# Patient Record
Sex: Female | Born: 1945 | Race: White | Hispanic: No | Marital: Married | Smoking: Never smoker
Health system: Southern US, Community
[De-identification: ages and names within clinical notes are randomized; demographics above are authoritative.]

## PROBLEM LIST (undated history)

## (undated) DIAGNOSIS — E7800 Pure hypercholesterolemia, unspecified: Secondary | ICD-10-CM

## (undated) HISTORY — PX: HEMORRHOIDECTOMY WITH HEMORRHOID BANDING: SHX5633

## (undated) HISTORY — DX: Pure hypercholesterolemia, unspecified: E78.00

---

## 1999-08-30 ENCOUNTER — Encounter: Admission: RE | Admit: 1999-08-30 | Discharge: 1999-08-30 | Payer: Self-pay | Admitting: Obstetrics and Gynecology

## 2000-09-11 ENCOUNTER — Encounter: Admission: RE | Admit: 2000-09-11 | Discharge: 2000-09-11 | Payer: Self-pay | Admitting: Obstetrics and Gynecology

## 2000-09-11 ENCOUNTER — Encounter: Payer: Self-pay | Admitting: Obstetrics and Gynecology

## 2001-02-28 ENCOUNTER — Encounter (INDEPENDENT_AMBULATORY_CARE_PROVIDER_SITE_OTHER): Payer: Self-pay

## 2001-02-28 ENCOUNTER — Other Ambulatory Visit: Admission: RE | Admit: 2001-02-28 | Discharge: 2001-02-28 | Payer: Self-pay | Admitting: Obstetrics and Gynecology

## 2001-09-13 ENCOUNTER — Encounter: Admission: RE | Admit: 2001-09-13 | Discharge: 2001-09-13 | Payer: Self-pay | Admitting: Obstetrics and Gynecology

## 2001-09-13 ENCOUNTER — Encounter: Payer: Self-pay | Admitting: Obstetrics and Gynecology

## 2002-09-29 ENCOUNTER — Encounter: Admission: RE | Admit: 2002-09-29 | Discharge: 2002-09-29 | Payer: Self-pay | Admitting: Obstetrics and Gynecology

## 2002-09-29 ENCOUNTER — Encounter: Payer: Self-pay | Admitting: Obstetrics and Gynecology

## 2003-10-20 ENCOUNTER — Encounter: Admission: RE | Admit: 2003-10-20 | Discharge: 2003-10-20 | Payer: Self-pay | Admitting: Obstetrics and Gynecology

## 2004-11-10 ENCOUNTER — Encounter: Admission: RE | Admit: 2004-11-10 | Discharge: 2004-11-10 | Payer: Self-pay | Admitting: Obstetrics and Gynecology

## 2005-02-24 ENCOUNTER — Ambulatory Visit: Payer: Self-pay | Admitting: Family Medicine

## 2005-03-15 ENCOUNTER — Ambulatory Visit: Payer: Self-pay | Admitting: Family Medicine

## 2005-04-28 ENCOUNTER — Ambulatory Visit: Payer: Self-pay | Admitting: Family Medicine

## 2005-09-01 ENCOUNTER — Ambulatory Visit: Payer: Self-pay | Admitting: Family Medicine

## 2005-11-28 ENCOUNTER — Encounter: Admission: RE | Admit: 2005-11-28 | Discharge: 2005-11-28 | Payer: Self-pay | Admitting: Obstetrics and Gynecology

## 2006-11-30 ENCOUNTER — Encounter: Admission: RE | Admit: 2006-11-30 | Discharge: 2006-11-30 | Payer: Self-pay | Admitting: Obstetrics and Gynecology

## 2007-12-10 ENCOUNTER — Encounter: Admission: RE | Admit: 2007-12-10 | Discharge: 2007-12-10 | Payer: Self-pay | Admitting: Obstetrics and Gynecology

## 2008-12-10 ENCOUNTER — Encounter: Admission: RE | Admit: 2008-12-10 | Discharge: 2008-12-10 | Payer: Self-pay | Admitting: Family Medicine

## 2010-01-05 ENCOUNTER — Encounter: Admission: RE | Admit: 2010-01-05 | Discharge: 2010-01-05 | Payer: Self-pay | Admitting: Obstetrics and Gynecology

## 2010-02-16 ENCOUNTER — Encounter: Admission: RE | Admit: 2010-02-16 | Discharge: 2010-02-16 | Payer: Self-pay | Admitting: Obstetrics and Gynecology

## 2010-12-11 ENCOUNTER — Encounter: Payer: Self-pay | Admitting: Obstetrics and Gynecology

## 2010-12-12 ENCOUNTER — Other Ambulatory Visit: Payer: Self-pay | Admitting: Obstetrics and Gynecology

## 2010-12-12 DIAGNOSIS — Z1239 Encounter for other screening for malignant neoplasm of breast: Secondary | ICD-10-CM

## 2011-01-06 ENCOUNTER — Ambulatory Visit
Admission: RE | Admit: 2011-01-06 | Discharge: 2011-01-06 | Disposition: A | Discharge status: Home / Self Care | Payer: 59 | Source: Ambulatory Visit | Attending: Obstetrics and Gynecology | Admitting: Obstetrics and Gynecology

## 2011-01-06 DIAGNOSIS — Z1239 Encounter for other screening for malignant neoplasm of breast: Secondary | ICD-10-CM

## 2011-10-31 ENCOUNTER — Other Ambulatory Visit: Payer: Self-pay | Admitting: Obstetrics and Gynecology

## 2011-10-31 DIAGNOSIS — N61 Mastitis without abscess: Secondary | ICD-10-CM

## 2011-11-23 ENCOUNTER — Ambulatory Visit
Admission: RE | Admit: 2011-11-23 | Discharge: 2011-11-23 | Disposition: A | Discharge status: Home / Self Care | Payer: 59 | Source: Ambulatory Visit | Attending: Obstetrics and Gynecology | Admitting: Obstetrics and Gynecology

## 2011-11-23 DIAGNOSIS — N61 Mastitis without abscess: Secondary | ICD-10-CM

## 2011-12-07 ENCOUNTER — Other Ambulatory Visit: Payer: Self-pay | Admitting: Obstetrics and Gynecology

## 2011-12-07 DIAGNOSIS — Z1231 Encounter for screening mammogram for malignant neoplasm of breast: Secondary | ICD-10-CM

## 2012-01-08 ENCOUNTER — Ambulatory Visit
Admission: RE | Admit: 2012-01-08 | Discharge: 2012-01-08 | Disposition: A | Discharge status: Home / Self Care | Payer: 59 | Source: Ambulatory Visit | Attending: Obstetrics and Gynecology | Admitting: Obstetrics and Gynecology

## 2012-01-08 DIAGNOSIS — Z1231 Encounter for screening mammogram for malignant neoplasm of breast: Secondary | ICD-10-CM

## 2012-12-09 ENCOUNTER — Other Ambulatory Visit: Payer: Self-pay | Admitting: Obstetrics and Gynecology

## 2012-12-09 DIAGNOSIS — Z1231 Encounter for screening mammogram for malignant neoplasm of breast: Secondary | ICD-10-CM

## 2013-01-08 ENCOUNTER — Other Ambulatory Visit: Payer: Self-pay | Admitting: Family Medicine

## 2013-01-08 ENCOUNTER — Ambulatory Visit
Admission: RE | Admit: 2013-01-08 | Discharge: 2013-01-08 | Disposition: A | Discharge status: Home / Self Care | Payer: 59 | Source: Ambulatory Visit | Attending: Obstetrics and Gynecology | Admitting: Obstetrics and Gynecology

## 2013-01-14 ENCOUNTER — Other Ambulatory Visit: Payer: Self-pay | Admitting: Obstetrics and Gynecology

## 2013-01-27 ENCOUNTER — Ambulatory Visit
Admission: RE | Admit: 2013-01-27 | Discharge: 2013-01-27 | Disposition: A | Discharge status: Home / Self Care | Payer: 59 | Source: Ambulatory Visit | Attending: Obstetrics and Gynecology | Admitting: Obstetrics and Gynecology

## 2014-01-20 ENCOUNTER — Other Ambulatory Visit: Payer: Self-pay

## 2014-01-20 DIAGNOSIS — Z1231 Encounter for screening mammogram for malignant neoplasm of breast: Secondary | ICD-10-CM

## 2014-02-09 ENCOUNTER — Ambulatory Visit
Admission: RE | Admit: 2014-02-09 | Discharge: 2014-02-09 | Disposition: A | Discharge status: Home / Self Care | Payer: Medicare Other | Source: Ambulatory Visit

## 2014-02-09 DIAGNOSIS — Z1231 Encounter for screening mammogram for malignant neoplasm of breast: Secondary | ICD-10-CM

## 2014-02-12 ENCOUNTER — Other Ambulatory Visit: Payer: Self-pay | Admitting: Obstetrics and Gynecology

## 2014-02-12 DIAGNOSIS — R928 Other abnormal and inconclusive findings on diagnostic imaging of breast: Secondary | ICD-10-CM

## 2014-02-19 ENCOUNTER — Ambulatory Visit
Admission: RE | Admit: 2014-02-19 | Discharge: 2014-02-19 | Disposition: A | Discharge status: Home / Self Care | Payer: Medicare Other | Source: Ambulatory Visit | Attending: Obstetrics and Gynecology | Admitting: Obstetrics and Gynecology

## 2014-02-19 DIAGNOSIS — R928 Other abnormal and inconclusive findings on diagnostic imaging of breast: Secondary | ICD-10-CM

## 2015-01-12 ENCOUNTER — Other Ambulatory Visit: Payer: Self-pay

## 2015-01-12 DIAGNOSIS — Z1231 Encounter for screening mammogram for malignant neoplasm of breast: Secondary | ICD-10-CM

## 2015-02-11 ENCOUNTER — Ambulatory Visit: Payer: Medicare Other

## 2015-02-19 ENCOUNTER — Ambulatory Visit
Admission: RE | Admit: 2015-02-19 | Discharge: 2015-02-19 | Disposition: A | Discharge status: Home / Self Care | Payer: Medicare Other | Source: Ambulatory Visit

## 2015-02-19 DIAGNOSIS — Z1231 Encounter for screening mammogram for malignant neoplasm of breast: Secondary | ICD-10-CM

## 2016-01-21 ENCOUNTER — Other Ambulatory Visit: Payer: Self-pay | Admitting: Obstetrics and Gynecology

## 2016-01-21 DIAGNOSIS — R5381 Other malaise: Secondary | ICD-10-CM

## 2016-02-09 ENCOUNTER — Other Ambulatory Visit: Payer: Self-pay | Admitting: Obstetrics and Gynecology

## 2016-02-10 ENCOUNTER — Other Ambulatory Visit: Payer: Self-pay | Admitting: Obstetrics and Gynecology

## 2016-02-10 DIAGNOSIS — M810 Age-related osteoporosis without current pathological fracture: Secondary | ICD-10-CM

## 2016-02-10 DIAGNOSIS — Z1231 Encounter for screening mammogram for malignant neoplasm of breast: Secondary | ICD-10-CM

## 2016-03-02 ENCOUNTER — Ambulatory Visit
Admission: RE | Admit: 2016-03-02 | Discharge: 2016-03-02 | Disposition: A | Discharge status: Home / Self Care | Payer: Medicare Other | Source: Ambulatory Visit | Attending: Obstetrics and Gynecology | Admitting: Obstetrics and Gynecology

## 2016-03-02 DIAGNOSIS — M810 Age-related osteoporosis without current pathological fracture: Secondary | ICD-10-CM

## 2016-03-02 DIAGNOSIS — Z1231 Encounter for screening mammogram for malignant neoplasm of breast: Secondary | ICD-10-CM

## 2017-01-19 ENCOUNTER — Other Ambulatory Visit: Payer: Self-pay | Admitting: Obstetrics and Gynecology

## 2017-01-19 DIAGNOSIS — Z1231 Encounter for screening mammogram for malignant neoplasm of breast: Secondary | ICD-10-CM

## 2017-03-08 ENCOUNTER — Ambulatory Visit
Admission: RE | Admit: 2017-03-08 | Discharge: 2017-03-08 | Disposition: A | Discharge status: Home / Self Care | Payer: Medicare Other | Source: Ambulatory Visit | Attending: Obstetrics and Gynecology | Admitting: Obstetrics and Gynecology

## 2017-03-08 DIAGNOSIS — Z1231 Encounter for screening mammogram for malignant neoplasm of breast: Secondary | ICD-10-CM

## 2018-01-30 ENCOUNTER — Other Ambulatory Visit: Payer: Self-pay | Admitting: Obstetrics and Gynecology

## 2018-01-30 DIAGNOSIS — Z1231 Encounter for screening mammogram for malignant neoplasm of breast: Secondary | ICD-10-CM

## 2018-02-20 ENCOUNTER — Other Ambulatory Visit: Payer: Self-pay | Admitting: Obstetrics and Gynecology

## 2018-02-20 DIAGNOSIS — M810 Age-related osteoporosis without current pathological fracture: Secondary | ICD-10-CM

## 2018-03-11 ENCOUNTER — Ambulatory Visit: Payer: Medicare Other

## 2018-03-22 ENCOUNTER — Ambulatory Visit
Admission: RE | Admit: 2018-03-22 | Discharge: 2018-03-22 | Disposition: A | Discharge status: Home / Self Care | Payer: Medicare Other | Source: Ambulatory Visit | Attending: Obstetrics and Gynecology | Admitting: Obstetrics and Gynecology

## 2018-03-22 DIAGNOSIS — M810 Age-related osteoporosis without current pathological fracture: Secondary | ICD-10-CM

## 2018-03-22 DIAGNOSIS — Z1231 Encounter for screening mammogram for malignant neoplasm of breast: Secondary | ICD-10-CM

## 2018-08-21 ENCOUNTER — Encounter: Payer: Self-pay | Admitting: Allergy and Immunology

## 2018-08-21 ENCOUNTER — Ambulatory Visit (INDEPENDENT_AMBULATORY_CARE_PROVIDER_SITE_OTHER): Payer: Medicare Other | Admitting: Allergy and Immunology

## 2018-08-21 VITALS — BP 102/70 | HR 80 | Temp 98.4°F | Resp 18 | Ht 64.2 in | Wt 123.0 lb

## 2018-08-21 DIAGNOSIS — T781XXD Other adverse food reactions, not elsewhere classified, subsequent encounter: Secondary | ICD-10-CM

## 2018-08-21 DIAGNOSIS — R197 Diarrhea, unspecified: Secondary | ICD-10-CM

## 2018-08-21 DIAGNOSIS — K5229 Other allergic and dietetic gastroenteritis and colitis: Secondary | ICD-10-CM | POA: Diagnosis not present

## 2018-08-21 DIAGNOSIS — K909 Intestinal malabsorption, unspecified: Secondary | ICD-10-CM

## 2018-08-21 DIAGNOSIS — Z91018 Allergy to other foods: Secondary | ICD-10-CM

## 2018-08-21 MED ORDER — COLESTIPOL HCL 1 G PO TABS: ORAL_TABLET | ORAL | 5 refills | Status: AC

## 2018-08-21 NOTE — Patient Instructions (Addendum)
  1.  Allergen avoidance measures?  2.  Blood -celiac screen with IgA  3.  Colestipol 1 g -1 tablet 1-2 times a day  4.  Contact clinic with update regarding response to colestipol in 2 weeks  5.  Further evaluation and treatment?

## 2018-08-21 NOTE — Progress Notes (Signed)
Dear Dr. Sol Passer,  Thank you for referring Kathleen Gross to the [facility] [place] on 08/21/2018.   Below is a summation of this patient's evaluation and recommendations.  Thank you for your referral. I will keep you informed about this patient's response to treatment.   If you have any questions please do not hesitate to contact me.   Sincerely,  Jessica Priest, MD Allergy / Immunology [place] [facility]   ______________________________________________________________________    NEW PATIENT NOTE  Referring Provider: Olive Bass, MD Primary Provider: Olive Bass, MD Date of office visit: 08/21/2018    Subjective:   Chief Complaint:  Kathleen Gross (DOB: 02/25/1946) is a 72 y.o. female who presents to the clinic on 08/21/2018 with a chief complaint of Diarrhea .     HPI: Kathleen Gross presents to this clinic in evaluation of diarrhea and possible food allergy.  She has a greater than 10-year history of problems with diarrhea.  Most of this diarrhea appears to be postprandial and it occurs on most days of the week and sometimes it will express itself only in the morning after breakfast or it can extend into the entire day.  She does not appear to have cramping or bloating associated with this diarrhea.  She does have tenesmus with this diarrhea and sometimes it is difficult for her to make it to the bathroom.  She has apparently had a colonoscopy 3 years ago by Dr. Jennye Boroughs which was normal.  She has tried Metamucil and Pepto-Bismol and hyoscyamine none of which has helped her to any great degree.  She is wondering about the possibility of a food allergy contributing to this issue.  She does not really have any significant allergic disease.  She did have a history of seasonal allergic rhinitis which was very mild as a young adult but for the most part that has resolved.  Past Medical History:    Diagnosis Date  . High cholesterol     Past Surgical History:  Procedure Laterality Date  . HEMORRHOIDECTOMY WITH HEMORRHOID BANDING      Allergies as of 08/21/2018   No Known Allergies     Medication List      CENTRUM SILVER PO Take by mouth.   CITRACAL PO Take by mouth.   hyoscyamine 0.125 MG SL tablet Commonly known as:  LEVSIN SL TAKE 1 TABLET (0.125 MG TOTAL) BY MOUTH EVERY 4 (FOUR) HOURS AS NEEDED FOR CRAMPING.   simvastatin 40 MG tablet Commonly known as:  ZOCOR TAKE 1 TABLET (40 MG TOTAL) BY MOUTH EVERY EVENING. CHOLESTEROL   Vitamin D3 1000 units Caps Take by mouth.       Review of systems negative except as noted in HPI / PMHx or noted below:  Review of Systems  Constitutional: Negative.   HENT: Negative.   Eyes: Negative.   Respiratory: Negative.   Cardiovascular: Negative.   Gastrointestinal: Negative.   Genitourinary: Negative.   Musculoskeletal: Negative.   Skin: Negative.   Neurological: Negative.   Endo/Heme/Allergies: Negative.   Psychiatric/Behavioral: Negative.     Family History  Problem Relation Age of Onset  . Hypertension Mother   . Lung cancer Brother        Agent orange  . Breast cancer Neg Hx     Social History   Socioeconomic History  . Marital status: Married    Spouse name: Not on file  .  Number of children: Not on file  . Years of education: Not on file  . Highest education level: Not on file  Occupational History  . Not on file  Social Needs  . Financial resource strain: Not on file  . Food insecurity:    Worry: Not on file    Inability: Not on file  . Transportation needs:    Medical: Not on file    Non-medical: Not on file  Tobacco Use  . Smoking status: Never Smoker  . Smokeless tobacco: Never Used  Substance and Sexual Activity  . Alcohol use: Not on file  . Drug use: Not on file  . Sexual activity: Not on file  Lifestyle  . Physical activity:    Days per week: Not on file    Minutes per  session: Not on file  . Stress: Not on file  Relationships  . Social connections:    Talks on phone: Not on file    Gets together: Not on file    Attends religious service: Not on file    Active member of club or organization: Not on file    Attends meetings of clubs or organizations: Not on file    Relationship status: Not on file  . Intimate partner violence:    Fear of current or ex partner: Not on file    Emotionally abused: Not on file    Physically abused: Not on file    Forced sexual activity: Not on file  Other Topics Concern  . Not on file  Social History Narrative  . Not on file    Environmental and Social history  Lives in a house with a dry environment, a dog located inside the household, carpet in the bedroom, plastic on the bed, plastic on the pillow, no smokers located inside the household.  Objective:   Vitals:   08/21/18 0935  BP: 102/70  Pulse: 80  Resp: 18  Temp: 98.4 F (36.9 C)   Height: 5' 4.2" (163.1 cm) Weight: 123 lb (55.8 kg)  Physical Exam  HENT:  Head: Normocephalic. Head is without right periorbital erythema and without left periorbital erythema.  Right Ear: Tympanic membrane, external ear and ear canal normal.  Left Ear: Tympanic membrane, external ear and ear canal normal.  Nose: Nose normal. No mucosal edema or rhinorrhea.  Mouth/Throat: Oropharynx is clear and moist and mucous membranes are normal. No oropharyngeal exudate.  Eyes: Pupils are equal, round, and reactive to light. Conjunctivae and lids are normal.  Neck: Trachea normal. No tracheal deviation present. No thyromegaly present.  Cardiovascular: Normal rate, regular rhythm, S1 normal, S2 normal and normal heart sounds.  No murmur heard. Pulmonary/Chest: Effort normal. No stridor. No respiratory distress. She has no wheezes. She has no rales. She exhibits no tenderness.  Abdominal: Soft. She exhibits no distension and no mass. There is no hepatosplenomegaly. There is no  tenderness. There is no rebound and no guarding.  Musculoskeletal: She exhibits no edema or tenderness.  Lymphadenopathy:       Head (right side): No tonsillar adenopathy present.       Head (left side): No tonsillar adenopathy present.    She has no cervical adenopathy.    She has no axillary adenopathy.  Neurological: She is alert.  Skin: No rash noted. She is not diaphoretic. No erythema. No pallor. Nails show no clubbing.    Diagnostics: Allergy skin tests were performed.  She did not demonstrate any hypersensitivity against the screening panel of foods.  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 Food allergy     1.  Allergen avoidance measures?  2.  Blood -celiac screen with IgA  3.  Colestipol 1 g -1 tablet 1-2 times a day  4.  Contact clinic with update regarding response to colestipol in 2 weeks  5.  Further evaluation and treatment?  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  We will further explore possible adverse responses to food by checking a celiac screen.  I will contact her with the results of these blood test once they are available for review and she will contact me in a few weeks regarding her response to colestipol.  Jessica Priest, MD Allergy / Immunology [place] [place]

## 2018-08-22 ENCOUNTER — Encounter: Payer: Self-pay | Admitting: Allergy and Immunology

## 2018-08-23 LAB — CELIAC DISEASE AB SCREEN W/RFX
Antigliadin Abs, IgA: 4 units (ref 0–19)
IgA/Immunoglobulin A, Serum: 255 mg/dL (ref 64–422)
Transglutaminase IgA: <2 U/mL (ref 0–3)

## 2018-09-06 ENCOUNTER — Telehealth: Payer: Self-pay | Admitting: *Deleted

## 2018-09-06 NOTE — Telephone Encounter (Signed)
Kathleen Gross was told to call back in two weeks with an update after starting her Colestipol.  She is taking it twice daily and she states that she has only has a couple of days with diarrhea over the last 2 weeks. She is trying to pay attention to what she eats in regards to greasy foods and coffee. Please advise.

## 2018-09-09 NOTE — Telephone Encounter (Signed)
OK, please have her continue to use colestipol and check back with [place] in December or earlier if problem.

## 2018-09-09 NOTE — Telephone Encounter (Signed)
LM to contact office.

## 2018-09-09 NOTE — Telephone Encounter (Signed)
Patient informed, she will call us if she has any other issues.

## 2018-09-12 ENCOUNTER — Ambulatory Visit: Payer: Medicare Other | Admitting: Allergy and Immunology

## 2018-12-05 ENCOUNTER — Encounter: Payer: Self-pay | Admitting: Allergy and Immunology

## 2018-12-05 ENCOUNTER — Ambulatory Visit (INDEPENDENT_AMBULATORY_CARE_PROVIDER_SITE_OTHER): Payer: Medicare Other | Admitting: Allergy and Immunology

## 2018-12-05 VITALS — BP 122/80 | HR 69 | Resp 16

## 2018-12-05 DIAGNOSIS — K909 Intestinal malabsorption, unspecified: Secondary | ICD-10-CM

## 2018-12-05 DIAGNOSIS — R197 Diarrhea, unspecified: Secondary | ICD-10-CM

## 2018-12-05 MED ORDER — COLESTIPOL HCL 1 G PO TABS: ORAL_TABLET | ORAL | 5 refills | Status: AC

## 2018-12-05 NOTE — Patient Instructions (Addendum)
  1.  Increase Colestipol 1 g -1 tablet 4 times per day  2. Does this help over this next month?

## 2018-12-05 NOTE — Progress Notes (Signed)
     Follow-up Note  Referring Provider: Olive Bass, MD Primary Provider: Olive Bass, MD Date of Office Visit: 12/05/2018  Subjective:   Kathleen Gross (DOB: 1946-09-27) is a 73 y.o. female who [facility] on 12/05/2018 in re-evaluation of the following:  HPI: Kathleen Gross returns to this clinic in reevaluation of chronic diarrhea addressed during her initial evaluation of 21 August 2018.  During her last visit we started her on colestipol 2 g daily and she has noticed some improvement regarding her diarrhea.  It does appear as though the transit time through her gut is much longer.  She can now eat some fatty foods without it passing through her gut very quickly.  However, she still has some intermittent issues with having diarrhea sometimes throughout the entire day although the volume of this diarrhea appears to have decreased somewhat.  Allergies as of 12/05/2018   No Known Allergies     Medication List      CENTRUM SILVER PO Take by mouth.   CITRACAL PO Take by mouth.   colestipol 1 g tablet Commonly known as:  COLESTID Take one tablet once or twice daily as directed   simvastatin 40 MG tablet Commonly known as:  ZOCOR TAKE 1 TABLET (40 MG TOTAL) BY MOUTH EVERY EVENING. CHOLESTEROL   Vitamin D3 25 MCG (1000 UT) Caps Take by mouth.       Past Medical History:  Diagnosis Date  . High cholesterol     Past Surgical History:  Procedure Laterality Date  . HEMORRHOIDECTOMY WITH HEMORRHOID BANDING      Review of systems negative except as noted in HPI / PMHx or noted below:  Review of Systems  Constitutional: Negative.   HENT: Negative.   Eyes: Negative.   Respiratory: Negative.   Cardiovascular: Negative.   Gastrointestinal: Negative.   Genitourinary: Negative.   Musculoskeletal: Negative.   Skin: Negative.   Neurological: Negative.   Endo/Heme/Allergies: Negative.   Psychiatric/Behavioral: Negative.      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  Increase Colestipol 1 g -1 tablet 4 times per day  2. Does this help over this next month?  Kathleen Gross will increase her dose of colestipol and make a decision about which dose is correct for her regarding her chronic diarrhea.  She does appear to have some improvement while using 2 g daily.  She will keep in contact with me noting her response to this approach.  Laurette Schimke, MD Allergy / Immunology [facility]

## 2018-12-09 ENCOUNTER — Encounter: Payer: Self-pay | Admitting: Allergy and Immunology

## 2018-12-20 ENCOUNTER — Telehealth: Payer: Self-pay | Admitting: *Deleted

## 2018-12-20 NOTE — Telephone Encounter (Signed)
FYI: Kathleen Gross calls stating that increasing her Colestipol has not helped. She is going to get up with her PCP and see what her other options are.

## 2019-02-11 ENCOUNTER — Other Ambulatory Visit: Payer: Self-pay | Admitting: Family Medicine

## 2019-02-11 DIAGNOSIS — Z1231 Encounter for screening mammogram for malignant neoplasm of breast: Secondary | ICD-10-CM

## 2019-02-19 NOTE — Telephone Encounter (Signed)
Please asked patient if she has ever been evaluated for pancreatic insufficiency.  This can cause chronic diarrhea and there is a treatment for this but it usually requires some evaluation for pancreatic insufficiency before administering a medication.  Does she have a GI doctor?

## 2019-02-19 NOTE — Telephone Encounter (Signed)
Call to pt, she has full bottle, she states that she doubled the medication up and it does not work.  Would like something different.   Please advise

## 2019-02-19 NOTE — Telephone Encounter (Signed)
Contact patient. Does she want the colestipol filled? If so, please fill.

## 2019-02-19 NOTE — Telephone Encounter (Signed)
[facility] sent refill request for Colestipol but patient stated it was not helping back on 12/21/2019. Please advise on medication refill

## 2019-02-19 NOTE — Telephone Encounter (Signed)
Patient states she has a GI appt coming in late June of 2020 and will notify us what they say regarding her sx. I tried to get the doctors name but had difficult hearing who she was seeing due to interference in the call.

## 2019-04-17 ENCOUNTER — Ambulatory Visit: Payer: Medicare Other

## 2019-04-26 ENCOUNTER — Other Ambulatory Visit: Payer: Self-pay

## 2019-04-26 ENCOUNTER — Ambulatory Visit
Admission: RE | Admit: 2019-04-26 | Discharge: 2019-04-26 | Disposition: A | Discharge status: Home / Self Care | Payer: Medicare Other | Source: Ambulatory Visit | Attending: Family Medicine | Admitting: Family Medicine

## 2019-04-26 DIAGNOSIS — Z1231 Encounter for screening mammogram for malignant neoplasm of breast: Secondary | ICD-10-CM

## 2019-12-23 ENCOUNTER — Ambulatory Visit: Payer: Medicare Other | Attending: Gastroenterology | Admitting: Physical Therapy

## 2019-12-23 ENCOUNTER — Encounter: Payer: Self-pay | Admitting: Physical Therapy

## 2019-12-23 ENCOUNTER — Other Ambulatory Visit: Payer: Self-pay

## 2019-12-23 DIAGNOSIS — R279 Unspecified lack of coordination: Secondary | ICD-10-CM | POA: Insufficient documentation

## 2019-12-23 DIAGNOSIS — R252 Cramp and spasm: Secondary | ICD-10-CM | POA: Insufficient documentation

## 2019-12-23 DIAGNOSIS — M6281 Muscle weakness (generalized): Secondary | ICD-10-CM | POA: Insufficient documentation

## 2019-12-24 NOTE — Therapy (Addendum)
[facility] [address] [place], [place], [address] Phone: [phone]   Fax:  [phone]  Physical Therapy Evaluation  Patient Details  Name: Kathleen Gross MRN: [id number] Date of Birth: January 30, 1946 Referring Provider (PT): Justice Deeds, [place]   Encounter Date: 12/23/2019  PT End of Session - 12/24/19 1056    Visit Number  1    Date for PT Re-Evaluation  02/17/20    PT Start Time  1230    PT Stop Time  1311    PT Time Calculation (min)  41 min    Activity Tolerance  Patient tolerated treatment well    Behavior During Therapy  [facility] for tasks assessed/performed       Past Medical History:  Diagnosis Date  . High cholesterol     Past Surgical History:  Procedure Laterality Date  . HEMORRHOIDECTOMY WITH HEMORRHOID BANDING      There were no vitals filed for this visit.   Subjective Assessment - 12/23/19 1232    Subjective  Pt states incontinence started about 7 years ago having stuff really going through me.  Pt states 2019 it really started getting bad. Pt states she notices that it is fatty things.  States she has not had gallbladder assessed.    Diagnostic tests  x-ray shows something in lower colon, anorectal manometry    Patient Stated Goals  improve bowel function; stop having incontinence    Currently in Pain?  No/denies         [facility] PT Assessment - 12/29/19 0001      Assessment   Medical Diagnosis  K59.00 (ICD-10-CM) - Constipation, unspecified; R15.9 (ICD-10-CM) - Full incontinence of feces    Referring Provider (PT)  Justice Deeds, PA-C    Prior Therapy  No      Precautions   Precautions  None      Restrictions   Weight Bearing Restrictions  No      Balance Screen   Has the patient fallen in the past 6 months  No      Home Environment   Living Environment  Private residence    Living Arrangements  Spouse/significant other      Prior Function   Level of Independence   Independent    Leisure  housework and walking      Cognition   Overall Cognitive Status  Within Functional Limits for tasks assessed      Posture/Postural Control   Posture/Postural Control  Postural limitations    Postural Limitations  Rounded Shoulders;Anterior pelvic tilt      AROM   Overall AROM Comments  lumbar 30% limited      PROM   Overall PROM Comments  WFL      Strength   Overall Strength Comments  hip abd and adduction 4/5 bilat      Flexibility   Soft Tissue Assessment /Muscle Length  yes    Hamstrings  75%      Palpation   Palpation comment  lumbar paraspinals tight      Ambulation/Gait   Gait Pattern  Within Functional Limits                Objective measurements completed on examination: See above findings.    Pelvic Floor Special Questions - 12/29/19 0001    Prior Pregnancies  Yes    Number of Pregnancies  3    Number of Vaginal Deliveries  3    Urinary Leakage  No    Urinary urgency  No    Fecal incontinence  Yes    Fluid intake  fecal incontinence: sometimes can't tell that I have leakage some days 4-5 pads on a bad day    Caffeine beverages  1.5 coffee and 2-3 large glasses of water/tea    Skin Integrity  Irritaion present at    Pelvic Floor Internal Exam  pt identity confirmed and internal soft tissue assessment performed with informed consent from patient    Palpation  reduced coordination with excess use of accessory muscles    Strength  weak squeeze, no lift    Strength # of seconds  2    Tone  low       [facility] Adult PT Treatment/Exercise - 12/29/19 0001      Self-Care   Self-Care  Other Self-Care Comments    Other Self-Care Comments   initial HEP             PT Education - 12/29/19 1956    Education Details  Access Code: [email or web address]    Person(s) Educated  Patient    Methods  Explanation;Demonstration;Verbal cues;Handout;Tactile cues    Comprehension  Verbalized understanding;Returned demonstration       PT Short  Term Goals - 12/29/19 1949      PT SHORT TERM GOAL #1   Title  ind with initial HEP and urge techniques    Time  4    Period  Weeks    Status  New    Target Date  01/20/20        PT Long Term Goals - 12/29/19 1946      PT LONG TERM GOAL #1   Title  Pt will be ind with advanced HEP    Time  8    Period  Weeks    Status  New    Target Date  02/17/20      PT LONG TERM GOAL #2   Title  Pt will be able to perform kegel correctly and hold for at least 15 seconds while breathing for ability to reduce the urge and get to rest room without leakage    Baseline  2 sec hold and uses accessory muscles such as glutes and inner thighs    Time  8    Period  Weeks    Status  New    Target Date  02/17/20      PT LONG TERM GOAL #3   Title  Pt will report at least 30% less bearing down for BM when constipated due to improved coordination    Time  8    Period  Weeks    Status  New    Target Date  02/17/20      PT LONG TERM GOAL #4   Title  Pt will report at least 30% less fecal incontinence    Time  8    Period  Weeks    Status  New    Target Date  02/17/20             Plan - 12/29/19 1950    Clinical Impression Statement  Pt presents to PT due to fecal incontinence that has been an ongoing issue for many years but became much worse in the past 1-2 years.  Pt notices issues with fattening foods and does have IBS.  Upon examination she does also have significant pelvic floor weakness.  Pt has tight lumbar paraspinals and decreased lumbar flexion ROM.  Pt  has LE weakness and decreased h/s flexibility as mentioned above . Pt will benefit from skilled PT to address impairments in order to reduce incontinence and improve quality of life and return to normal activities.    Personal Factors and Comorbidities  Time since onset of injury/illness/exacerbation;Comorbidity 1    Comorbidities  IBS    Examination-Activity Limitations  Continence    Examination-Participation Restrictions   Community Activity    Stability/Clinical Decision Making  Stable/Uncomplicated    Clinical Decision Making  Low    Rehab Potential  Excellent    PT Frequency  2x / week    PT Duration  8 weeks    PT Treatment/Interventions  ADLs/Self Care Home Management;Biofeedback;Electrical Stimulation;Cryotherapy;Moist Heat;Therapeutic activities;Therapeutic exercise;Neuromuscular re-education;Patient/family education;Manual techniques;Passive range of motion;Taping;Dry needling    PT Next Visit Plan  lumbar stretch, h/s stretch, biofeedback    PT Home Exercise Plan  Access Code: [email or web address]    Consulted and Agree with Plan of Care  Patient       Patient will benefit from skilled therapeutic intervention in order to improve the following deficits and impairments:  Decreased coordination, Increased muscle spasms, Impaired flexibility, Impaired tone, Postural dysfunction, Decreased range of motion, Decreased strength  Visit Diagnosis: Muscle weakness (generalized)  Cramp and spasm  Unspecified lack of coordination     Problem List There are no problems to display for this patient.   Jule Ser, PT 12/29/2019, 8:01 PM  [facility] [address] [place], [place], [address] Phone: [phone]   Fax:  [phone]  Name: Kathleen Gross MRN: [id number] Date of Birth: 1946/05/01

## 2019-12-29 NOTE — Patient Instructions (Signed)
Access Code: [email or web address]  URL: [email or web address]  Date: 12/29/2019  Prepared by: Dwana Curd   Exercises Supine Single Knee to Chest Stretch - 5 reps - 1 sets - 10 sec hold                            - 2x daily - 7x weekly Supine Double Knee to Chest - 5 reps - 1 sets - 10 sec hold - 2x daily - 7x weekly Sidelying Clamshell with Pelvic Floor Contraction - 10 reps - 1 sets - 2 sec hold - 2x daily - 7x weekly

## 2019-12-29 NOTE — Addendum Note (Signed)
Addended by: Beatris Si on: 12/29/2019 08:10 PM   Modules accepted: Orders

## 2019-12-30 NOTE — Addendum Note (Signed)
Addended by: Beatris Si on: 12/30/2019 09:33 AM   Modules accepted: Orders

## 2020-01-01 NOTE — Addendum Note (Signed)
Addended by: Beatris Si on: 01/01/2020 11:33 AM   Modules accepted: Orders

## 2020-01-02 ENCOUNTER — Other Ambulatory Visit: Payer: Self-pay

## 2020-01-02 ENCOUNTER — Ambulatory Visit: Payer: Medicare Other | Admitting: Physical Therapy

## 2020-01-02 DIAGNOSIS — R252 Cramp and spasm: Secondary | ICD-10-CM

## 2020-01-02 DIAGNOSIS — M6281 Muscle weakness (generalized): Secondary | ICD-10-CM | POA: Diagnosis not present

## 2020-01-02 DIAGNOSIS — R279 Unspecified lack of coordination: Secondary | ICD-10-CM

## 2020-01-02 NOTE — Therapy (Signed)
[facility] [address], [place], [place], [address] Phone: [phone]   Fax:  [phone]  Physical Therapy Treatment  Patient Details  Name: Kathleen Gross MRN: [id number] Date of Birth: [birth year] Referring Provider (PT): Noel Journey, [place]   Encounter Date: 01/02/2020  PT End of Session - 01/02/20 1108    Visit Number  2    Date for PT Re-Evaluation  02/17/20    PT Start Time  1103    PT Stop Time  1143    PT Time Calculation (min)  40 min    Activity Tolerance  Patient tolerated treatment well    Behavior During Therapy  [facility] for tasks assessed/performed       Past Medical History:  Diagnosis Date  . High cholesterol     Past Surgical History:  Procedure Laterality Date  . HEMORRHOIDECTOMY WITH HEMORRHOID BANDING      There were no vitals filed for this visit.  Subjective Assessment - 01/02/20 1109    Subjective  No changes yet. I have had no problem with the stretches, but not sure if I am doing the clamshell correctly.  I had leakage 2 x and had to strain 1x.    Diagnostic tests  x-ray shows something in lower colon, anorectal manometry    Patient Stated Goals  improve bowel function; stop having incontinence    Currently in Pain?  No/denies                       [facility] Adult PT Treatment/Exercise - 01/02/20 0001      Exercises   Exercises  Lumbar      Lumbar Exercises: Stretches   Active Hamstring Stretch  Right;Left;3 reps;30 seconds    Figure 4 Stretch  1 rep;30 seconds;With overpressure;Supine    Gastroc Stretch  Right;Left;2 reps;20 seconds      Lumbar Exercises: Seated   Other Seated Lumbar Exercises  kegel with ball squeeze - 10x 2 sec      Lumbar Exercises: Supine   Glut Set Limitations  kegel in side lying with TC - 10x 2 sec hold; kegel in supine with ball squeeze TC - 10x 3 sec hold      Manual Therapy   Manual Therapy  Soft tissue mobilization    Soft  tissue mobilization  lumbar and thoracic paraspinals and trigger point release               PT Short Term Goals - 01/02/20 1146      PT SHORT TERM GOAL #1   Title  ind with initial HEP and urge techniques    Baseline  issued HEP today    Status  On-going        PT Long Term Goals - 12/29/19 1946      PT LONG TERM GOAL #1   Title  Pt will be ind with advanced HEP    Time  8    Period  Weeks    Status  New    Target Date  02/17/20      PT LONG TERM GOAL #2   Title  Pt will be able to perform kegel correctly and hold for at least 15 seconds while breathing for ability to reduce the urge and get to rest room without leakage    Baseline  2 sec hold and uses accessory muscles such as glutes and inner thighs    Time  8  Period  Weeks    Status  New    Target Date  02/17/20      PT LONG TERM GOAL #3   Title  Pt will report at least 30% less bearing down for BM when constipated due to improved coordination    Time  8    Period  Weeks    Status  New    Target Date  02/17/20      PT LONG TERM GOAL #4   Title  Pt will report at least 30% less fecal incontinence    Time  8    Period  Weeks    Status  New    Target Date  02/17/20            Plan - 01/02/20 1149    Clinical Impression Statement  Pt has not met goals yet due to initial treatment.  Pt had 3 trigger points in lumbar paraspinals and lower thoracic that released using STM . Pt was able to update HEP to include more stretching and addition of kegel with ball squeeze.  Palpation to pelvic floor provided pt with feedback that she was performing correctly.  Pt will benefit from skilled PT to continue with POC    PT Treatment/Interventions  ADLs/Self Care Home Management;Biofeedback;Electrical Stimulation;Cryotherapy;Moist Heat;Therapeutic activities;Therapeutic exercise;Neuromuscular re-education;Patient/family education;Manual techniques;Passive range of motion;Taping;Dry needling    PT Next Visit Plan   lumbar stretch, h/s stretch, biofeedback    PT Home Exercise Plan  Access Code: [email or web address]    Consulted and Agree with Plan of Care  Patient       Patient will benefit from skilled therapeutic intervention in order to improve the following deficits and impairments:  Decreased coordination, Increased muscle spasms, Impaired flexibility, Impaired tone, Postural dysfunction, Decreased range of motion, Decreased strength  Visit Diagnosis: Muscle weakness (generalized)  Cramp and spasm  Unspecified lack of coordination     Problem List There are no problems to display for this patient.   Camillo Flaming Betsabe Iglesia, PT 01/02/2020, 12:00 PM  [facility] [address] [place], [place], [address] Phone: [phone]   Fax:  [phone]  Name: Kathleen Gross MRN: [id number] Date of Birth: 07-10-1946

## 2020-01-08 ENCOUNTER — Encounter: Payer: Medicare Other | Admitting: Physical Therapy

## 2020-01-15 ENCOUNTER — Other Ambulatory Visit: Payer: Self-pay

## 2020-01-15 ENCOUNTER — Ambulatory Visit: Payer: Medicare Other | Admitting: Physical Therapy

## 2020-01-15 DIAGNOSIS — M6281 Muscle weakness (generalized): Secondary | ICD-10-CM

## 2020-01-15 DIAGNOSIS — R252 Cramp and spasm: Secondary | ICD-10-CM

## 2020-01-15 DIAGNOSIS — R279 Unspecified lack of coordination: Secondary | ICD-10-CM

## 2020-01-15 NOTE — Patient Instructions (Signed)
Access Code: [email or web address]  URL: [email or web address]  Date: 01/15/2020  Prepared by: Dwana Curd   Exercises Supine Single Knee to Chest Stretch - 5 reps - 1 sets - 10 sec hold                            - 2x daily - 7x weekly Supine Double Knee to Chest - 5 reps - 1 sets - 10 sec hold - 2x daily - 7x weekly Sidelying Clamshell with Pelvic Floor Contraction - 10 reps - 1 sets - 2 sec hold - 2x daily - 7x weekly Supine Hip Adductor Squeeze with Small Ball - 10 reps - 1 sets - 3 sec hold - 7x weekly Gastroc Stretch on Wall - 3 reps - 1 sets - 30 sec hold - 1x daily - 7x weekly Standing Hamstring Stretch with Step - 3 reps - 1 sets - 30 sec hold - 1x daily - 7x weekly Mini Squat with Pelvic Floor Contraction - 5 reps - 2 sets - 1x daily - 7x weekly

## 2020-01-15 NOTE — Therapy (Signed)
[facility] [address] [place], [place], [address] Phone: [phone]   Fax:  [phone]  Physical Therapy Treatment  Patient Details  Name: Kathleen Gross MRN: [id number] Date of Birth: Jul 19, 1946 Referring Provider (PT): Justice Deeds, [place]   Encounter Date: 01/15/2020  PT End of Session - 01/15/20 1220    Visit Number  3    Date for PT Re-Evaluation  02/17/20    PT Start Time  1102    PT Stop Time  1143    PT Time Calculation (min)  41 min    Activity Tolerance  Patient tolerated treatment well    Behavior During Therapy  [facility] for tasks assessed/performed       Past Medical History:  Diagnosis Date  . High cholesterol     Past Surgical History:  Procedure Laterality Date  . HEMORRHOIDECTOMY WITH HEMORRHOID BANDING      There were no vitals filed for this visit.  Subjective Assessment - 01/15/20 1119    Subjective  My hip felt better with the stretches                       [facility] Adult PT Treatment/Exercise - 01/15/20 0001      Self-Care   Other Self-Care Comments   urge techniques      Neuro Re-ed    Neuro Re-ed Details   biofeedback; rest 3.6; max 17.6; 10 sec average 17.75mV; 20 sec hold  12.55mV; rest post test 2.1 mV;       Lumbar Exercises: Standing   Functional Squats Limitations  squat with contract and hold pelvic floor      Lumbar Exercises: Seated   Other Seated Lumbar Exercises  kegel with ball squeeze - 10x 2 sec   biofeedback for control up the steps            PT Education - 01/15/20 1141    Education Details  Access Code: [email or web address]    Person(s) Educated  Patient    Methods  Explanation;Demonstration;Verbal cues;Handout    Comprehension  Verbalized understanding;Returned demonstration       PT Short Term Goals - 01/02/20 1146      PT SHORT TERM GOAL #1   Title  ind with initial HEP and urge techniques    Baseline  issued HEP today    Status   On-going        PT Long Term Goals - 12/29/19 1946      PT LONG TERM GOAL #1   Title  Pt will be ind with advanced HEP    Time  8    Period  Weeks    Status  New    Target Date  02/17/20      PT LONG TERM GOAL #2   Title  Pt will be able to perform kegel correctly and hold for at least 15 seconds while breathing for ability to reduce the urge and get to rest room without leakage    Baseline  2 sec hold and uses accessory muscles such as glutes and inner thighs    Time  8    Period  Weeks    Status  New    Target Date  02/17/20      PT LONG TERM GOAL #3   Title  Pt will report at least 30% less bearing down for BM when constipated due to improved coordination    Time  8  Period  Weeks    Status  New    Target Date  02/17/20      PT LONG TERM GOAL #4   Title  Pt will report at least 30% less fecal incontinence    Time  8    Period  Weeks    Status  New    Target Date  02/17/20            Plan - 01/15/20 1207    Clinical Impression Statement  Pt used biofeedback during session today and was able to coordinate correctly for contract relax.  She did get fatigued with exercises.  Educated on taking longer rest breaks.   Pt has difficutly with controlling the level of contraction.  Pt will benefit from skilled PT to continue to work strength, endurance and muscle control of the pelvic floor.    PT Treatment/Interventions  ADLs/Self Care Home Management;Biofeedback;Electrical Stimulation;Cryotherapy;Moist Heat;Therapeutic activities;Therapeutic exercise;Neuromuscular re-education;Patient/family education;Manual techniques;Passive range of motion;Taping;Dry needling    PT Next Visit Plan  f/u on the squat, add wall slides, f/u on contract and hold, biofeedback again if needed, lumbar stretch, h/s stretch    PT Home Exercise Plan  Access Code: [email or web address]    Consulted and Agree with Plan of Care  Patient       Patient will benefit from skilled therapeutic intervention in  order to improve the following deficits and impairments:  Decreased coordination, Increased muscle spasms, Impaired flexibility, Impaired tone, Postural dysfunction, Decreased range of motion, Decreased strength  Visit Diagnosis: Muscle weakness (generalized)  Cramp and spasm  Unspecified lack of coordination     Problem List There are no problems to display for this patient.   Jule Ser, PT 01/15/2020, 12:21 PM  [facility] [address] [place], [place], [address] Phone: [phone]   Fax:  [phone]  Name: Kathleen Gross MRN: [id number] Date of Birth: 1946/10/23

## 2020-01-22 ENCOUNTER — Ambulatory Visit: Payer: Medicare Other | Attending: Gastroenterology | Admitting: Physical Therapy

## 2020-01-22 ENCOUNTER — Other Ambulatory Visit: Payer: Self-pay

## 2020-01-22 ENCOUNTER — Encounter: Payer: Self-pay | Admitting: Physical Therapy

## 2020-01-22 DIAGNOSIS — M6281 Muscle weakness (generalized): Secondary | ICD-10-CM

## 2020-01-22 DIAGNOSIS — R279 Unspecified lack of coordination: Secondary | ICD-10-CM | POA: Insufficient documentation

## 2020-01-22 DIAGNOSIS — R252 Cramp and spasm: Secondary | ICD-10-CM | POA: Diagnosis present

## 2020-01-22 NOTE — Therapy (Signed)
[facility] [address] [place], [place], [address] Phone: [phone]   Fax:  [phone]  Physical Therapy Treatment  Patient Details  Name: Kathleen Gross MRN: [id number] Date of Birth: 1946/09/11 Referring Provider (PT): Justice Deeds, [place]   Encounter Date: 01/22/2020  PT End of Session - 01/22/20 1145    Visit Number  4    Date for PT Re-Evaluation  02/17/20    PT Start Time  1100    PT Stop Time  1144    PT Time Calculation (min)  44 min    Activity Tolerance  Patient tolerated treatment well    Behavior During Therapy  [facility] for tasks assessed/performed       Past Medical History:  Diagnosis Date  . High cholesterol     Past Surgical History:  Procedure Laterality Date  . HEMORRHOIDECTOMY WITH HEMORRHOID BANDING      There were no vitals filed for this visit.  Subjective Assessment - 01/22/20 1103    Subjective  I had leakage one day and it wasn't too bad. I keeping notes on things I eat.    Patient Stated Goals  improve bowel function; stop having incontinence    Currently in Pain?  No/denies                       [facility] Adult PT Treatment/Exercise - 01/22/20 0001      Lumbar Exercises: Stretches   Active Hamstring Stretch  Right;Left;3 reps;30 seconds    Other Lumbar Stretch Exercise  sitting on towel and breathing to lengthen pelvic floor      Lumbar Exercises: Standing   Shoulder Adduction Limitations  shoulder press in staggard stace for anti rotation movement    Other Standing Lumbar Exercises  sliders - side and back - 15 x each way    Other Standing Lumbar Exercises  hip abduction red band - 15x each      Lumbar Exercises: Seated   Hip Flexion on Ball  Strengthening;Both;20 reps               PT Short Term Goals - 01/02/20 1146      PT SHORT TERM GOAL #1   Title  ind with initial HEP and urge techniques    Baseline  issued HEP today    Status  On-going         PT Long Term Goals - 12/29/19 1946      PT LONG TERM GOAL #1   Title  Pt will be ind with advanced HEP    Time  8    Period  Weeks    Status  New    Target Date  02/17/20      PT LONG TERM GOAL #2   Title  Pt will be able to perform kegel correctly and hold for at least 15 seconds while breathing for ability to reduce the urge and get to rest room without leakage    Baseline  2 sec hold and uses accessory muscles such as glutes and inner thighs    Time  8    Period  Weeks    Status  New    Target Date  02/17/20      PT LONG TERM GOAL #3   Title  Pt will report at least 30% less bearing down for BM when constipated due to improved coordination    Time  8    Period  Weeks    Status  New    Target Date  02/17/20      PT LONG TERM GOAL #4   Title  Pt will report at least 30% less fecal incontinence    Time  8    Period  Weeks    Status  New    Target Date  02/17/20            Plan - 01/22/20 1152    Clinical Impression Statement  Pt reports she has been having less leakage but notices she is clenching throughout the day.  Pt was educated on doing the stretching and relaxing 2x/day.  Pt did well with exercise progress.  She is able to sustain a 5 sec hold when contracting.  Pt is recommended to continue to work on muscle coordination and endurance.    PT Treatment/Interventions  ADLs/Self Care Home Management;Biofeedback;Electrical Stimulation;Cryotherapy;Moist Heat;Therapeutic activities;Therapeutic exercise;Neuromuscular re-education;Patient/family education;Manual techniques;Passive range of motion;Taping;Dry needling    PT Next Visit Plan  f/u on stretch and breathing; progress endurance exercises;    PT Home Exercise Plan  Access Code: [email or web address]    Consulted and Agree with Plan of Care  Patient       Patient will benefit from skilled therapeutic intervention in order to improve the following deficits and impairments:  Decreased coordination, Increased muscle  spasms, Impaired flexibility, Impaired tone, Postural dysfunction, Decreased range of motion, Decreased strength  Visit Diagnosis: Muscle weakness (generalized)  Cramp and spasm  Unspecified lack of coordination     Problem List There are no problems to display for this patient.   Camillo Flaming Edwing Figley, PT 01/22/2020, 12:05 PM  [facility] [address], [place], [place], [address] Phone: [phone]   Fax:  [phone]  Name: Kathleen Gross MRN: [id number] Date of Birth: [birth year]

## 2020-01-29 ENCOUNTER — Encounter: Payer: Self-pay | Admitting: Physical Therapy

## 2020-01-29 ENCOUNTER — Other Ambulatory Visit: Payer: Self-pay

## 2020-01-29 ENCOUNTER — Ambulatory Visit: Payer: Medicare Other | Admitting: Physical Therapy

## 2020-01-29 DIAGNOSIS — R252 Cramp and spasm: Secondary | ICD-10-CM

## 2020-01-29 DIAGNOSIS — M6281 Muscle weakness (generalized): Secondary | ICD-10-CM | POA: Diagnosis not present

## 2020-01-29 DIAGNOSIS — R279 Unspecified lack of coordination: Secondary | ICD-10-CM

## 2020-01-29 NOTE — Therapy (Signed)
[facility] [address] [place], [place], [address] Phone: [phone]   Fax:  [phone]  Physical Therapy Treatment  Patient Details  Name: ALICE BURNSIDE MRN: [id number] Date of Birth: January 17, 1946 Referring Provider (PT): Justice Deeds, [place]   Encounter Date: 01/29/2020  PT End of Session - 01/29/20 1151    Visit Number  5    Date for PT Re-Evaluation  02/17/20    PT Start Time  1108   arrived late   PT Stop Time  1142    PT Time Calculation (min)  34 min    Activity Tolerance  Patient tolerated treatment well    Behavior During Therapy  [facility] for tasks assessed/performed       Past Medical History:  Diagnosis Date  . High cholesterol     Past Surgical History:  Procedure Laterality Date  . HEMORRHOIDECTOMY WITH HEMORRHOID BANDING      There were no vitals filed for this visit.  Subjective Assessment - 01/29/20 1109    Subjective  I had one day that had leakage but wasn't bad.    Diagnostic tests  x-ray shows something in lower colon, anorectal manometry    Patient Stated Goals  improve bowel function; stop having incontinence    Currently in Pain?  No/denies                       [facility] Adult PT Treatment/Exercise - 01/29/20 0001      Lumbar Exercises: Stretches   Press Ups Limitations  press up on forearms only halfway up - 10x      Manual Therapy   Manual Therapy  Myofascial release    Soft tissue mobilization  lumbar and thoracic paraspinals and trigger point release; gluteals    Myofascial Release  ascending and descending colon and rectal - fascial release performed with technique to abdominal wall using two hands and releasing fascia into all 6 planes               PT Short Term Goals - 01/02/20 1146      PT SHORT TERM GOAL #1   Title  ind with initial HEP and urge techniques    Baseline  issued HEP today    Status  On-going        PT Long Term Goals -  12/29/19 1946      PT LONG TERM GOAL #1   Title  Pt will be ind with advanced HEP    Time  8    Period  Weeks    Status  New    Target Date  02/17/20      PT LONG TERM GOAL #2   Title  Pt will be able to perform kegel correctly and hold for at least 15 seconds while breathing for ability to reduce the urge and get to rest room without leakage    Baseline  2 sec hold and uses accessory muscles such as glutes and inner thighs    Time  8    Period  Weeks    Status  New    Target Date  02/17/20      PT LONG TERM GOAL #3   Title  Pt will report at least 30% less bearing down for BM when constipated due to improved coordination    Time  8    Period  Weeks    Status  New    Target Date  02/17/20      PT LONG TERM GOAL #4   Title  Pt will report at least 30% less fecal incontinence    Time  8    Period  Weeks    Status  New    Target Date  02/17/20            Plan - 01/29/20 1156    Clinical Impression Statement  Pt tolerated soft tissue and myofascial release very well and got good release on the left side descending colon.  Pt still has tension througout lumbar and thoracic paraspinals.  She did press ups and has very limited extension.    PT Treatment/Interventions  ADLs/Self Care Home Management;Biofeedback;Electrical Stimulation;Cryotherapy;Moist Heat;Therapeutic activities;Therapeutic exercise;Neuromuscular re-education;Patient/family education;Manual techniques;Passive range of motion;Taping;Dry needling    PT Next Visit Plan  biofeedback to re-assess strength and endurance    PT Home Exercise Plan  Access Code: [email or web address]    Consulted and Agree with Plan of Care  Patient       Patient will benefit from skilled therapeutic intervention in order to improve the following deficits and impairments:  Decreased coordination, Increased muscle spasms, Impaired flexibility, Impaired tone, Postural dysfunction, Decreased range of motion, Decreased strength  Visit  Diagnosis: Muscle weakness (generalized)  Cramp and spasm  Unspecified lack of coordination     Problem List There are no problems to display for this patient.   Camillo Flaming Juliona Vales, PT 01/29/2020, 12:36 PM  [facility] [address], [place], [place], [address] Phone: [phone]   Fax:  [phone]  Name: ISHA SEEFELD MRN: [id number] Date of Birth: 1946-10-16

## 2020-02-03 ENCOUNTER — Ambulatory Visit: Payer: Medicare Other | Admitting: Physical Therapy

## 2020-02-03 ENCOUNTER — Encounter: Payer: Self-pay | Admitting: Physical Therapy

## 2020-02-03 ENCOUNTER — Other Ambulatory Visit: Payer: Self-pay

## 2020-02-03 DIAGNOSIS — M6281 Muscle weakness (generalized): Secondary | ICD-10-CM | POA: Diagnosis not present

## 2020-02-03 DIAGNOSIS — R279 Unspecified lack of coordination: Secondary | ICD-10-CM

## 2020-02-03 DIAGNOSIS — R252 Cramp and spasm: Secondary | ICD-10-CM

## 2020-02-03 NOTE — Therapy (Signed)
[facility] [address] [place], [place], [address] Phone: [phone]   Fax:  [phone]  Physical Therapy Treatment  Patient Details  Name: Kathleen Gross MRN: [id number] Date of Birth: May 27, 1946 Referring Provider (PT): Justice Deeds, [place]   Encounter Date: 02/03/2020  PT End of Session - 02/03/20 1101    Visit Number  6    Date for PT Re-Evaluation  02/17/20    PT Start Time  1101    PT Stop Time  1142    PT Time Calculation (min)  41 min    Activity Tolerance  Patient tolerated treatment well    Behavior During Therapy  [facility] for tasks assessed/performed       Past Medical History:  Diagnosis Date  . High cholesterol     Past Surgical History:  Procedure Laterality Date  . HEMORRHOIDECTOMY WITH HEMORRHOID BANDING      There were no vitals filed for this visit.  Subjective Assessment - 02/03/20 1255    Subjective  Feels like it is getting better.  Things were better this last week    Diagnostic tests  x-ray shows something in lower colon, anorectal manometry    Patient Stated Goals  improve bowel function; stop having incontinence    Currently in Pain?  No/denies                       [facility] Adult PT Treatment/Exercise - 02/03/20 0001      Neuro Re-ed    Neuro Re-ed Details   biofeedback; with step up and 6 sec hold in different standing positions      Lumbar Exercises: Standing   Forward Lunge  5 reps;5 seconds   both sides - 12 mV with staggered stance   Side Lunge  5 reps;5 seconds      Lumbar Exercises: Supine   Bent Knee Raise  10 reps;5 seconds    Bridge  10 reps   6 sec hold   Straight Leg Raise  10 reps;5 seconds               PT Short Term Goals - 01/02/20 1146      PT SHORT TERM GOAL #1   Title  ind with initial HEP and urge techniques    Baseline  issued HEP today    Status  On-going        PT Long Term Goals - 12/29/19 1946      PT LONG  TERM GOAL #1   Title  Pt will be ind with advanced HEP    Time  8    Period  Weeks    Status  New    Target Date  02/17/20      PT LONG TERM GOAL #2   Title  Pt will be able to perform kegel correctly and hold for at least 15 seconds while breathing for ability to reduce the urge and get to rest room without leakage    Baseline  2 sec hold and uses accessory muscles such as glutes and inner thighs    Time  8    Period  Weeks    Status  New    Target Date  02/17/20      PT LONG TERM GOAL #3   Title  Pt will report at least 30% less bearing down for BM when constipated due to improved coordination    Time  8  Period  Weeks    Status  New    Target Date  02/17/20      PT LONG TERM GOAL #4   Title  Pt will report at least 30% less fecal incontinence    Time  8    Period  Weeks    Status  New    Target Date  02/17/20            Plan - 02/03/20 1140    Clinical Impression Statement  Pt did well using biofeedback to help keep the timing of the exercises and for visual cues that she is engaging the correct muscles.  Pt was able to increase length of her hold to 6-10 seconds.  Pt was given updated exercises based on progression during session and she will benefit from skille dPT to ensure successful transition to HEP    PT Treatment/Interventions  ADLs/Self Care Home Management;Biofeedback;Electrical Stimulation;Cryotherapy;Moist Heat;Therapeutic activities;Therapeutic exercise;Neuromuscular re-education;Patient/family education;Manual techniques;Passive range of motion;Taping;Dry needling    PT Next Visit Plan  biofeedback; final HEP kneeling and half kneeling, quadruped    PT Home Exercise Plan  Access Code: [email or web address]    Consulted and Agree with Plan of Care  Patient       Patient will benefit from skilled therapeutic intervention in order to improve the following deficits and impairments:  Decreased coordination, Increased muscle spasms, Impaired flexibility, Impaired tone,  Postural dysfunction, Decreased range of motion, Decreased strength  Visit Diagnosis: Muscle weakness (generalized)  Cramp and spasm  Unspecified lack of coordination     Problem List There are no problems to display for this patient.   Camillo Flaming Towanda Hornstein, PT 02/03/2020, 12:59 PM  [facility] [address] [place], [place], [address] Phone: [phone]   Fax:  [phone]  Name: Kathleen Gross MRN: [id number] Date of Birth: 30-Jun-1946

## 2020-02-10 ENCOUNTER — Ambulatory Visit: Payer: Medicare Other | Admitting: Physical Therapy

## 2020-02-10 ENCOUNTER — Other Ambulatory Visit: Payer: Self-pay

## 2020-02-10 ENCOUNTER — Encounter: Payer: Self-pay | Admitting: Physical Therapy

## 2020-02-10 DIAGNOSIS — M6281 Muscle weakness (generalized): Secondary | ICD-10-CM | POA: Diagnosis not present

## 2020-02-10 DIAGNOSIS — R252 Cramp and spasm: Secondary | ICD-10-CM

## 2020-02-10 DIAGNOSIS — R279 Unspecified lack of coordination: Secondary | ICD-10-CM

## 2020-02-10 NOTE — Therapy (Signed)
[facility] [address] [place], [place], [address] Phone: [phone]   Fax:  [phone]  Physical Therapy Treatment  Patient Details  Name: Kathleen Gross MRN: [id number] Date of Birth: 1946/09/12 Referring Provider (PT): Noel Journey, [place]   Encounter Date: 02/10/2020  PT End of Session - 02/10/20 1151    Visit Number  7    Date for PT Re-Evaluation  02/17/20    PT Start Time  1150    PT Stop Time  1225    PT Time Calculation (min)  35 min    Activity Tolerance  Patient tolerated treatment well    Behavior During Therapy  [facility] for tasks assessed/performed       Past Medical History:  Diagnosis Date  . High cholesterol     Past Surgical History:  Procedure Laterality Date  . HEMORRHOIDECTOMY WITH HEMORRHOID BANDING      There were no vitals filed for this visit.  Subjective Assessment - 02/10/20 1236    Subjective  I feel 75-80% improved    Currently in Pain?  No/denies                       [facility] Adult PT Treatment/Exercise - 02/10/20 0001      Self-Care   Other Self-Care Comments   reviewed HEP and toilet techniques to ensure she is independent and no questions      Lumbar Exercises: Stretches   Other Lumbar Stretch Exercise  thoracic extension seated and standing with ball; standing with elbow on wall      Lumbar Exercises: Standing   Shoulder Extension  Strengthening;Both;20 reps;Theraband    Theraband Level (Shoulder Extension)  Level 1 (Yellow)    Other Standing Lumbar Exercises  pallof press red band 10x staggered stance; kneel with weighted ball lift      Lumbar Exercises: Seated   Other Seated Lumbar Exercises  seated on ball row from overhead blue               PT Short Term Goals - 02/10/20 1216      PT SHORT TERM GOAL #1   Title  ind with initial HEP and urge techniques    Status  Achieved        PT Long Term Goals - 02/10/20 1216      PT LONG  TERM GOAL #1   Title  Pt will be ind with advanced HEP    Status  Achieved      PT LONG TERM GOAL #2   Title  Pt will be able to perform kegel correctly and hold for at least 15 seconds while breathing for ability to reduce the urge and get to rest room without leakage    Baseline  able to get up to 10 sec from 2 sec at eval    Status  Partially Met      PT LONG TERM GOAL #3   Title  Pt will report at least 30% less bearing down for BM when constipated due to improved coordination    Baseline  75% less    Status  Achieved      PT LONG TERM GOAL #4   Title  Pt will report at least 30% less fecal incontinence    Baseline  75-80%    Status  Achieved            Plan - 02/10/20 1228    Clinical Impression Statement  Pt has met most of her goals and feeling much better with bowel movement and bowel control.  Pt is 75-80% improved overall.  She is independent with HEP and will discharge at this time now.    PT Treatment/Interventions  ADLs/Self Care Home Management;Biofeedback;Electrical Stimulation;Cryotherapy;Moist Heat;Therapeutic activities;Therapeutic exercise;Neuromuscular re-education;Patient/family education;Manual techniques;Passive range of motion;Taping;Dry needling    PT Next Visit Plan  d/c today    PT Home Exercise Plan  Access Code: [email or web address]    Consulted and Agree with Plan of Care  Patient       Patient will benefit from skilled therapeutic intervention in order to improve the following deficits and impairments:  Decreased coordination, Increased muscle spasms, Impaired flexibility, Impaired tone, Postural dysfunction, Decreased range of motion, Decreased strength  Visit Diagnosis: Muscle weakness (generalized)  Cramp and spasm  Unspecified lack of coordination     Problem List There are no problems to display for this patient.   Jule Ser, PT 02/10/2020, 12:37 PM  [facility] [address] [place], [place], [address] Phone: [phone]   Fax:  [phone]  Name: Kathleen Gross MRN: [id number] Date of Birth: 17-Jun-1946  PHYSICAL THERAPY DISCHARGE SUMMARY  Visits from Start of Care: 7  Current functional level related to goals / functional outcomes: See above   Remaining deficits: See above   Education / Equipment: HEP  Plan: Patient agrees to discharge.  Patient goals were met. Patient is being discharged due to being pleased with the current functional level.  ?????    [employer], PT 02/10/20 12:38 PM

## 2020-05-19 ENCOUNTER — Other Ambulatory Visit: Payer: Self-pay | Admitting: Nurse Practitioner

## 2020-05-19 DIAGNOSIS — Z1231 Encounter for screening mammogram for malignant neoplasm of breast: Secondary | ICD-10-CM

## 2020-05-19 DIAGNOSIS — M810 Age-related osteoporosis without current pathological fracture: Secondary | ICD-10-CM

## 2020-05-20 ENCOUNTER — Other Ambulatory Visit: Payer: Self-pay

## 2020-05-20 ENCOUNTER — Ambulatory Visit
Admission: RE | Admit: 2020-05-20 | Discharge: 2020-05-20 | Disposition: A | Discharge status: Home / Self Care | Payer: Medicare Other | Source: Ambulatory Visit | Attending: Nurse Practitioner | Admitting: Nurse Practitioner

## 2020-05-20 DIAGNOSIS — Z1231 Encounter for screening mammogram for malignant neoplasm of breast: Secondary | ICD-10-CM

## 2020-08-10 ENCOUNTER — Other Ambulatory Visit: Payer: Medicare Other

## 2020-08-10 ENCOUNTER — Ambulatory Visit: Payer: Medicare Other

## 2020-08-16 ENCOUNTER — Ambulatory Visit
Admission: RE | Admit: 2020-08-16 | Discharge: 2020-08-16 | Disposition: A | Discharge status: Home / Self Care | Payer: Medicare Other | Source: Ambulatory Visit | Attending: Nurse Practitioner | Admitting: Nurse Practitioner

## 2020-08-16 ENCOUNTER — Other Ambulatory Visit: Payer: Self-pay

## 2020-08-16 DIAGNOSIS — M810 Age-related osteoporosis without current pathological fracture: Secondary | ICD-10-CM

## 2021-04-08 ENCOUNTER — Other Ambulatory Visit: Payer: Self-pay | Admitting: Family Medicine

## 2021-04-08 DIAGNOSIS — Z1231 Encounter for screening mammogram for malignant neoplasm of breast: Secondary | ICD-10-CM

## 2021-06-06 ENCOUNTER — Ambulatory Visit
Admission: RE | Admit: 2021-06-06 | Discharge: 2021-06-06 | Disposition: A | Discharge status: Home / Self Care | Payer: Medicare HMO | Source: Ambulatory Visit | Attending: Family Medicine | Admitting: Family Medicine

## 2021-06-06 ENCOUNTER — Other Ambulatory Visit: Payer: Self-pay

## 2021-06-06 DIAGNOSIS — Z1231 Encounter for screening mammogram for malignant neoplasm of breast: Secondary | ICD-10-CM

## 2022-05-03 ENCOUNTER — Other Ambulatory Visit: Payer: Self-pay | Admitting: Nurse Practitioner

## 2022-05-03 DIAGNOSIS — Z1231 Encounter for screening mammogram for malignant neoplasm of breast: Secondary | ICD-10-CM

## 2022-05-17 ENCOUNTER — Other Ambulatory Visit: Payer: Self-pay | Admitting: Nurse Practitioner

## 2022-05-17 DIAGNOSIS — Z780 Asymptomatic menopausal state: Secondary | ICD-10-CM

## 2022-06-07 ENCOUNTER — Ambulatory Visit
Admission: RE | Admit: 2022-06-07 | Discharge: 2022-06-07 | Disposition: A | Discharge status: Home / Self Care | Payer: Medicare HMO | Source: Ambulatory Visit | Attending: Nurse Practitioner | Admitting: Nurse Practitioner

## 2022-06-07 DIAGNOSIS — Z1231 Encounter for screening mammogram for malignant neoplasm of breast: Secondary | ICD-10-CM

## 2022-10-30 ENCOUNTER — Ambulatory Visit
Admission: RE | Admit: 2022-10-30 | Discharge: 2022-10-30 | Disposition: A | Discharge status: Home / Self Care | Payer: Medicare HMO | Source: Ambulatory Visit | Attending: Nurse Practitioner | Admitting: Nurse Practitioner

## 2022-10-30 DIAGNOSIS — Z780 Asymptomatic menopausal state: Secondary | ICD-10-CM

## 2023-04-30 IMAGING — MG MM DIGITAL SCREENING BILAT W/ TOMO AND CAD
8 series · 9 of 24 positions shown · non-contrast
Comparison: Previous exam(s).

CLINICAL DATA: Screening.

EXAM:
DIGITAL SCREENING BILATERAL MAMMOGRAM WITH TOMOSYNTHESIS AND CAD
TECHNIQUE: Bilateral screening digital craniocaudal and mediolateral oblique
mammograms were obtained. Bilateral screening digital breast
tomosynthesis was performed. The images were evaluated with
computer-aided detection.

[R MLO synth-2D]
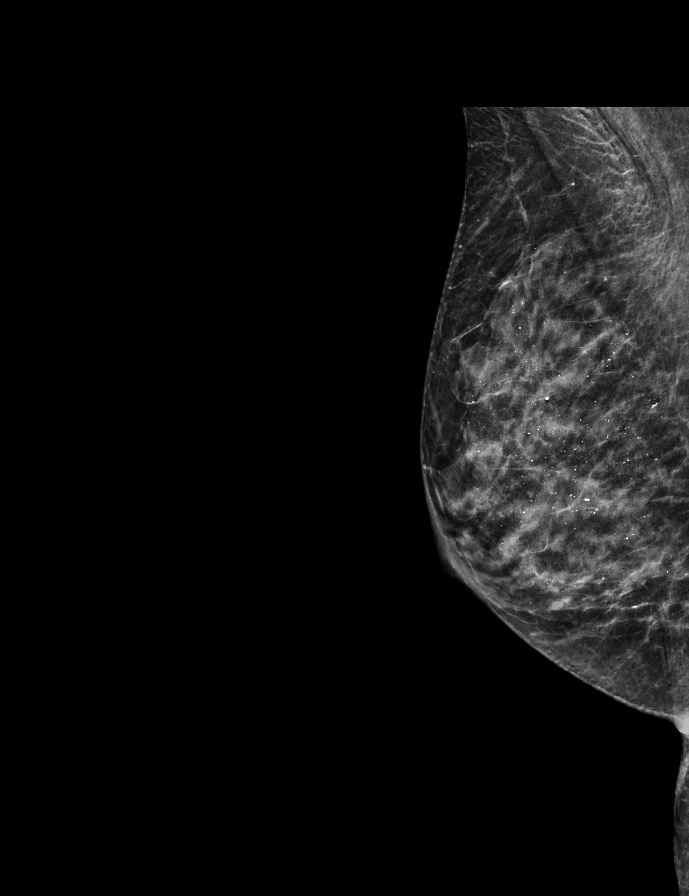

[L MLO synth-2D]
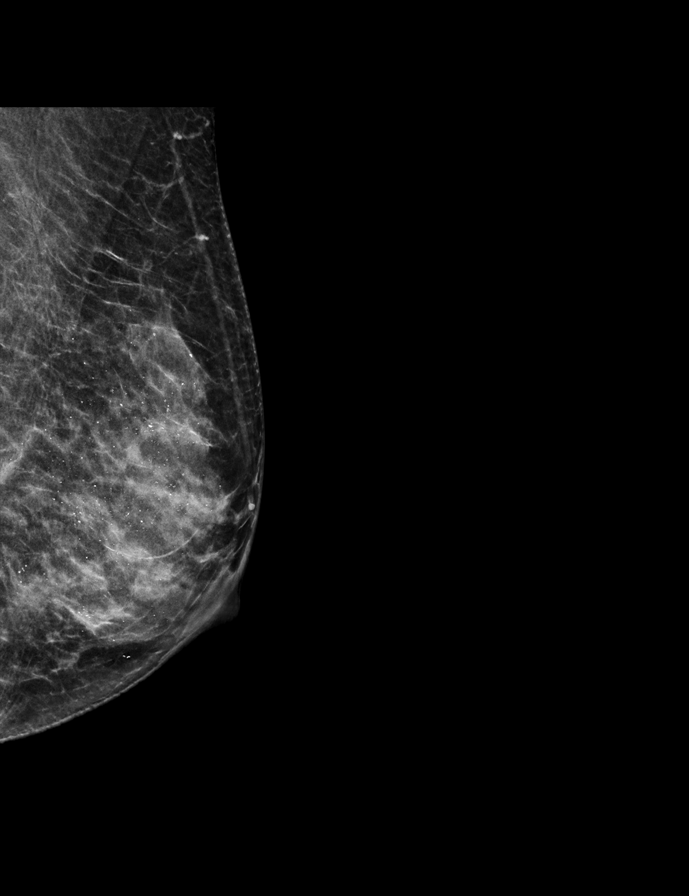

[L CC synth-2D]
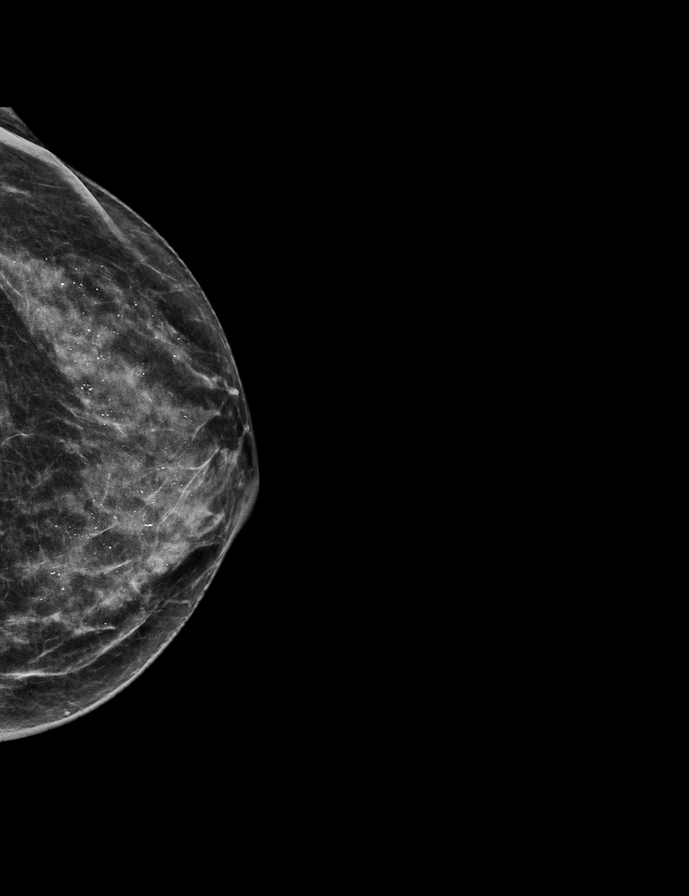

[R CC synth-2D]
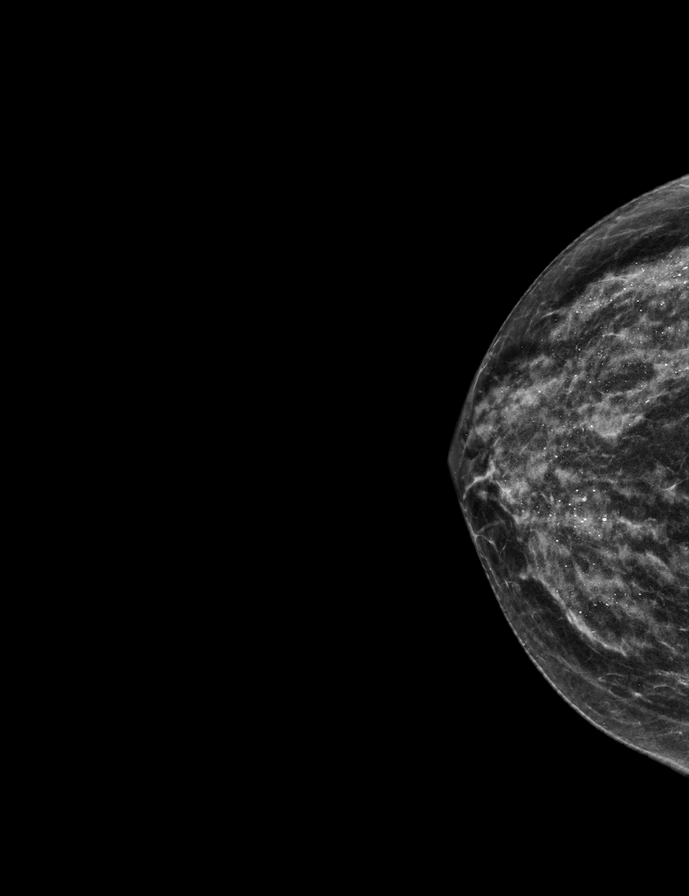

[R CC tomo · 2 of 45 frames shown]
[frame 15/45]
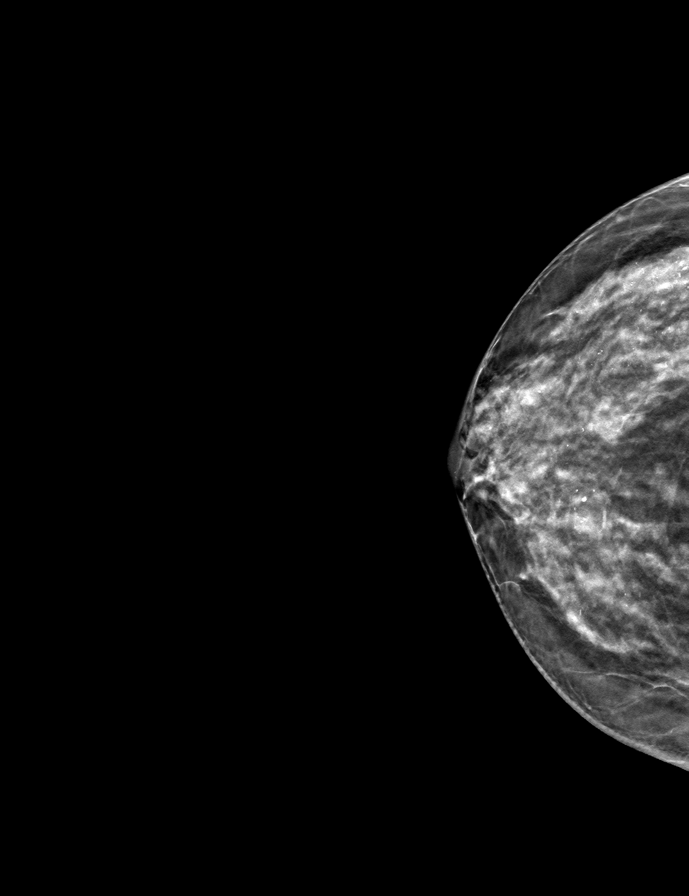
[frame 23/45]
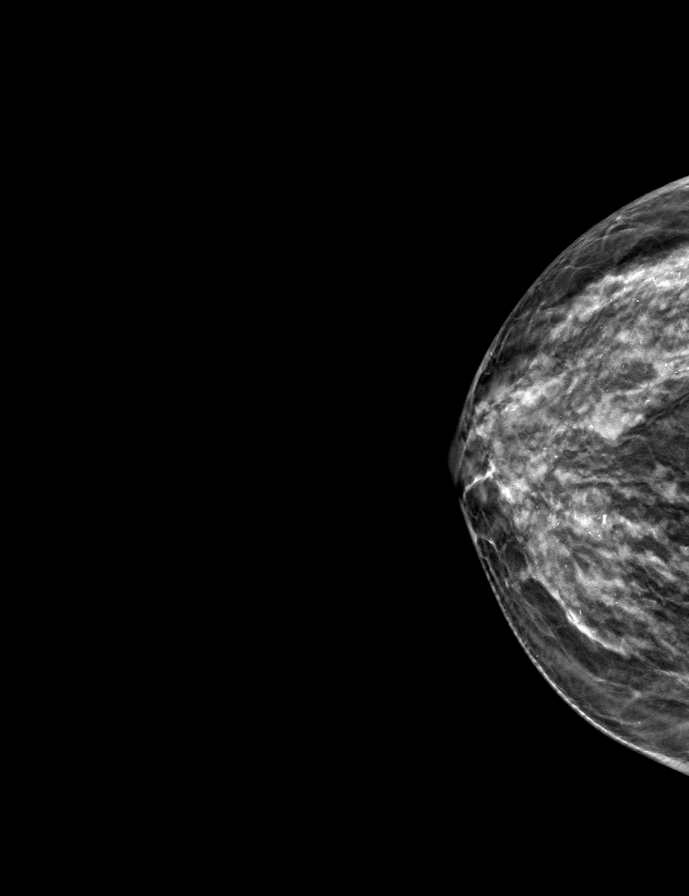

[L MLO tomo · tomo slice 25/50.0]
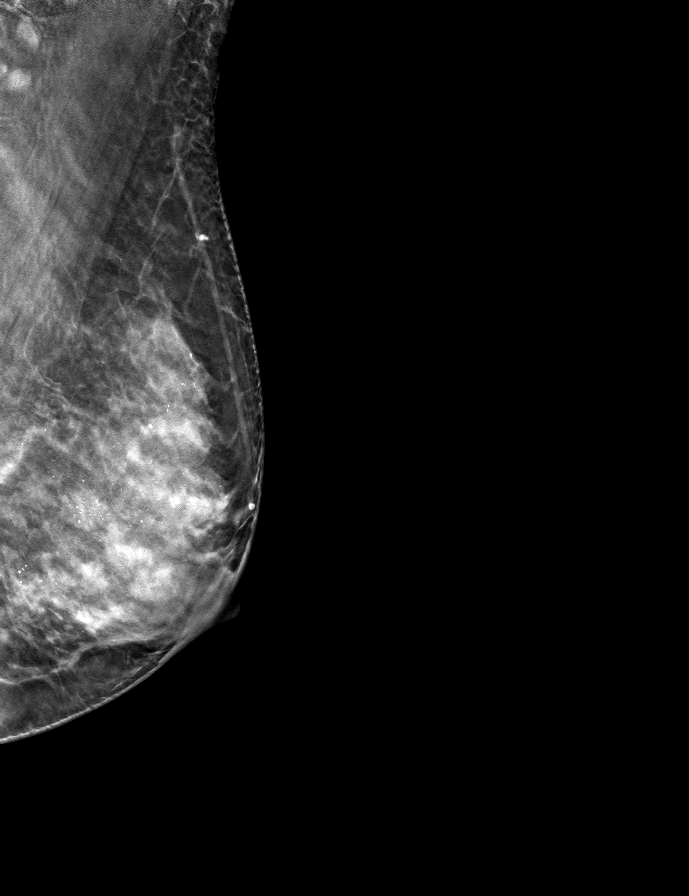

[R MLO tomo · tomo slice 25/48.0]
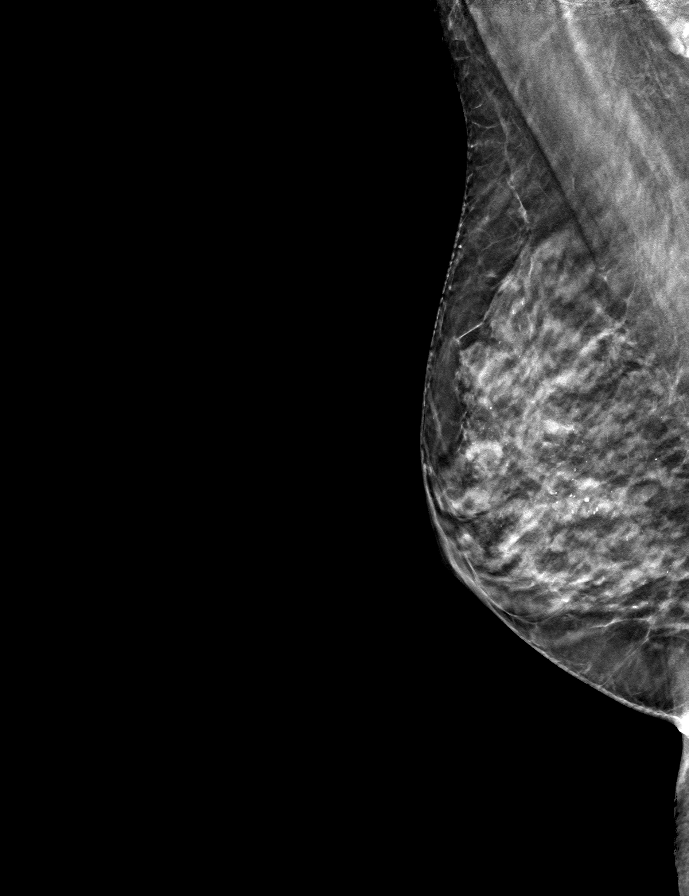

[L CC tomo · tomo slice 26/51.0]
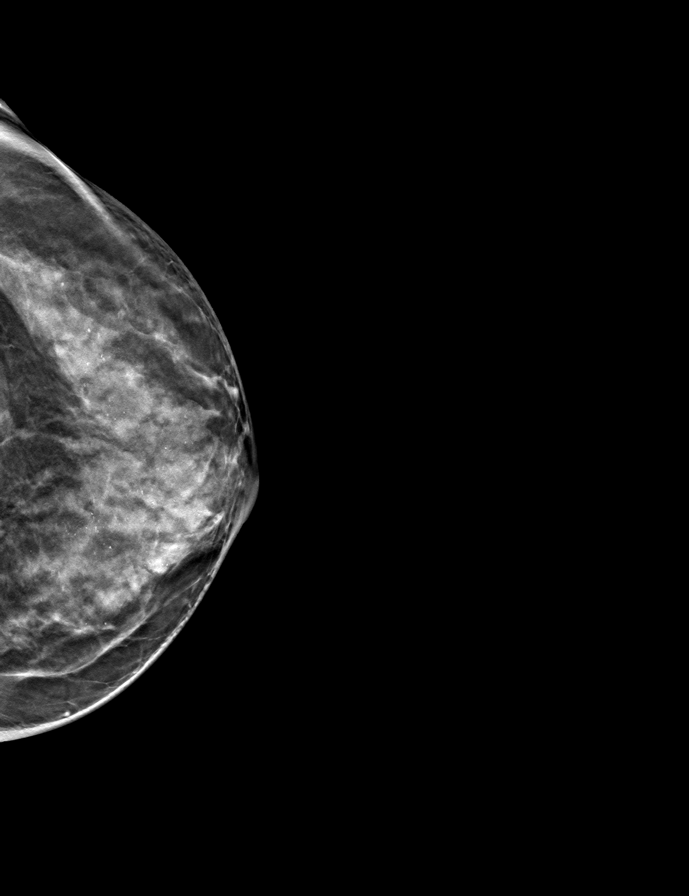

[9 of 24 positions shown; findings below may reference images not displayed]

ACR Breast Density Category c: The breast tissue is heterogeneously
dense, which may obscure small masses.
FINDINGS: There are no findings suspicious for malignancy.
IMPRESSION: No mammographic evidence of malignancy. A result letter of this
screening mammogram will be mailed directly to the patient.

RECOMMENDATION:
Screening mammogram in one year. (Code:Q3-W-BC3)

BI-RADS CATEGORY  1: Negative.

## 2023-05-01 ENCOUNTER — Other Ambulatory Visit: Payer: Self-pay | Admitting: Nurse Practitioner

## 2023-05-01 DIAGNOSIS — Z1231 Encounter for screening mammogram for malignant neoplasm of breast: Secondary | ICD-10-CM

## 2023-06-11 ENCOUNTER — Ambulatory Visit
Admission: RE | Admit: 2023-06-11 | Discharge: 2023-06-11 | Disposition: A | Discharge status: Home / Self Care | Payer: Medicare HMO | Source: Ambulatory Visit | Attending: Nurse Practitioner | Admitting: Nurse Practitioner

## 2023-06-11 DIAGNOSIS — Z1231 Encounter for screening mammogram for malignant neoplasm of breast: Secondary | ICD-10-CM

## 2023-06-13 ENCOUNTER — Other Ambulatory Visit: Payer: Self-pay | Admitting: Nurse Practitioner

## 2023-06-13 DIAGNOSIS — R928 Other abnormal and inconclusive findings on diagnostic imaging of breast: Secondary | ICD-10-CM

## 2023-06-21 ENCOUNTER — Ambulatory Visit
Admission: RE | Admit: 2023-06-21 | Discharge: 2023-06-21 | Disposition: A | Discharge status: Home / Self Care | Payer: Medicare HMO | Source: Ambulatory Visit | Attending: Nurse Practitioner | Admitting: Nurse Practitioner

## 2023-06-21 ENCOUNTER — Ambulatory Visit: Payer: Medicare HMO

## 2023-06-21 DIAGNOSIS — R928 Other abnormal and inconclusive findings on diagnostic imaging of breast: Secondary | ICD-10-CM

## 2024-05-06 ENCOUNTER — Other Ambulatory Visit: Payer: Self-pay | Admitting: Family Medicine

## 2024-05-06 DIAGNOSIS — Z1231 Encounter for screening mammogram for malignant neoplasm of breast: Secondary | ICD-10-CM

## 2024-05-22 ENCOUNTER — Other Ambulatory Visit (HOSPITAL_BASED_OUTPATIENT_CLINIC_OR_DEPARTMENT_OTHER): Payer: Self-pay | Admitting: Nurse Practitioner

## 2024-05-22 DIAGNOSIS — M810 Age-related osteoporosis without current pathological fracture: Secondary | ICD-10-CM

## 2024-06-11 ENCOUNTER — Ambulatory Visit
Admission: RE | Admit: 2024-06-11 | Discharge: 2024-06-11 | Disposition: A | Discharge status: Home / Self Care | Source: Ambulatory Visit | Attending: Family Medicine | Admitting: Family Medicine

## 2024-06-11 DIAGNOSIS — Z1231 Encounter for screening mammogram for malignant neoplasm of breast: Secondary | ICD-10-CM

## 2024-12-23 ENCOUNTER — Other Ambulatory Visit (HOSPITAL_BASED_OUTPATIENT_CLINIC_OR_DEPARTMENT_OTHER)

## 2024-12-31 ENCOUNTER — Other Ambulatory Visit (HOSPITAL_BASED_OUTPATIENT_CLINIC_OR_DEPARTMENT_OTHER): Admitting: Radiology
